# Patient Record
Sex: Male | Born: 1990 | Race: White | Hispanic: No | Marital: Single | Smoking: Never smoker
Health system: Southern US, Community
[De-identification: ages and names within clinical notes are randomized; demographics above are authoritative.]

## PROBLEM LIST (undated history)

## (undated) DIAGNOSIS — T7840XA Allergy, unspecified, initial encounter: Secondary | ICD-10-CM

## (undated) HISTORY — PX: FRACTURE SURGERY: SHX138

## (undated) HISTORY — DX: Allergy, unspecified, initial encounter: T78.40XA

---

## 2018-05-08 DIAGNOSIS — Z136 Encounter for screening for cardiovascular disorders: Secondary | ICD-10-CM | POA: Diagnosis not present

## 2018-05-08 DIAGNOSIS — Z0000 Encounter for general adult medical examination without abnormal findings: Secondary | ICD-10-CM | POA: Diagnosis not present

## 2018-05-08 DIAGNOSIS — Z23 Encounter for immunization: Secondary | ICD-10-CM | POA: Diagnosis not present

## 2019-01-06 DIAGNOSIS — Z20828 Contact with and (suspected) exposure to other viral communicable diseases: Secondary | ICD-10-CM | POA: Diagnosis not present

## 2019-02-24 ENCOUNTER — Other Ambulatory Visit: Payer: Self-pay

## 2019-02-24 DIAGNOSIS — Z20822 Contact with and (suspected) exposure to covid-19: Secondary | ICD-10-CM

## 2019-02-25 LAB — NOVEL CORONAVIRUS, NAA: SARS-CoV-2, NAA: NOT DETECTED

## 2019-03-05 ENCOUNTER — Other Ambulatory Visit: Payer: Self-pay

## 2019-03-05 DIAGNOSIS — Z20822 Contact with and (suspected) exposure to covid-19: Secondary | ICD-10-CM

## 2019-03-07 LAB — NOVEL CORONAVIRUS, NAA: SARS-CoV-2, NAA: NOT DETECTED

## 2019-05-07 ENCOUNTER — Other Ambulatory Visit: Payer: Self-pay

## 2019-05-07 DIAGNOSIS — Z20822 Contact with and (suspected) exposure to covid-19: Secondary | ICD-10-CM

## 2019-05-08 LAB — NOVEL CORONAVIRUS, NAA: SARS-CoV-2, NAA: NOT DETECTED

## 2020-02-01 DIAGNOSIS — Z20822 Contact with and (suspected) exposure to covid-19: Secondary | ICD-10-CM | POA: Diagnosis not present

## 2020-02-03 DIAGNOSIS — Z20822 Contact with and (suspected) exposure to covid-19: Secondary | ICD-10-CM | POA: Diagnosis not present

## 2020-02-04 ENCOUNTER — Ambulatory Visit
Admission: RE | Admit: 2020-02-04 | Discharge: 2020-02-04 | Disposition: A | Discharge status: Home / Self Care | Payer: No Typology Code available for payment source | Source: Ambulatory Visit | Attending: Family Medicine | Admitting: Family Medicine

## 2020-02-04 ENCOUNTER — Other Ambulatory Visit: Payer: Self-pay

## 2020-02-04 ENCOUNTER — Other Ambulatory Visit (INDEPENDENT_AMBULATORY_CARE_PROVIDER_SITE_OTHER): Payer: Self-pay

## 2020-02-04 ENCOUNTER — Encounter: Payer: Self-pay | Admitting: Family Medicine

## 2020-02-04 ENCOUNTER — Ambulatory Visit (INDEPENDENT_AMBULATORY_CARE_PROVIDER_SITE_OTHER): Payer: Self-pay | Admitting: Family Medicine

## 2020-02-04 VITALS — BP 126/84 | Ht 69.5 in | Wt 184.0 lb

## 2020-02-04 DIAGNOSIS — Y9315 Activity, underwater diving and snorkeling: Secondary | ICD-10-CM

## 2020-02-04 DIAGNOSIS — Z0189 Encounter for other specified special examinations: Secondary | ICD-10-CM

## 2020-02-04 LAB — POCT HEMOGLOBIN: Hemoglobin: 15.6 g/dL — AB (ref 11–14.6)

## 2020-02-04 LAB — POCT UA - GLUCOSE/PROTEIN
Glucose, UA: NEGATIVE
Protein, UA: NEGATIVE

## 2020-02-04 LAB — GLUCOSE, POCT (MANUAL RESULT ENTRY): POC Glucose: 89 mg/dl (ref 70–99)

## 2020-02-04 NOTE — Progress Notes (Signed)
PCP: No primary care provider on file.  Subjective:   HPI: Patient is a 29 y.o. male here for Patient is a 29 y.o. male here for dive physical.  Patient reports that he has been diving for a couple of years, he just completed the diving course at [facility], and has gone on a couple of diving trips.  He reports no issues with diving, denies any panic attacks, history of barotrauma, seizures, pneumothorax, chronic sinus issues, neurologic disorders, any cancer history, hearing problems/tinnitus, asthma.  He has not had Covid and has had the Covid vaccine.  No past medical history on file.  No current outpatient medications on file prior to visit.   No current facility-administered medications on file prior to visit.    History reviewed. No pertinent surgical history.  Not on File  Social History   Socioeconomic History  . Marital status: Single    Spouse name: Not on file  . Number of children: Not on file  . Years of education: Not on file  . Highest education level: Not on file  Occupational History  . Not on file  Tobacco Use  . Smoking status: Not on file  Substance and Sexual Activity  . Alcohol use: Not on file  . Drug use: Not on file  . Sexual activity: Not on file  Other Topics Concern  . Not on file  Social History Narrative  . Not on file   Social Determinants of Health   Financial Resource Strain:   . Difficulty of Paying Living Expenses: Not on file  Food Insecurity:   . Worried About Programme researcher, broadcasting/film/video in the Last Year: Not on file  . Ran Out of Food in the Last Year: Not on file  Transportation Needs:   . Lack of Transportation (Medical): Not on file  . Lack of Transportation (Non-Medical): Not on file  Physical Activity:   . Days of Exercise per Week: Not on file  . Minutes of Exercise per Session: Not on file  Stress:   . Feeling of Stress : Not on file  Social Connections:   . Frequency of Communication with Friends and Family:  Not on file  . Frequency of Social Gatherings with Friends and Family: Not on file  . Attends Religious Services: Not on file  . Active Member of Clubs or Organizations: Not on file  . Attends Banker Meetings: Not on file  . Marital Status: Not on file  Intimate Partner Violence:   . Fear of Current or Ex-Partner: Not on file  . Emotionally Abused: Not on file  . Physically Abused: Not on file  . Sexually Abused: Not on file    No family history on file.  BP 126/84   Ht 5' 9.5" (1.765 m)   Wt 184 lb (83.5 kg)   BMI 26.78 kg/m   Review of Systems: See HPI above.  Labs: Hemoglobin 15.6 glucose 89 UA within normal limits  Imaging: CXR shows no abnormalities.     Objective:  Physical Exam:  Gen: NAD, comfortable in exam room HEENT: EOMI, PERRL, normal visual field testing, oropharynx nonerythematous and within normal limits, tympanic membranes visualized bilaterally and without any abnormalities. Cardiovascular: Regular rate and rhythm both seated and standing, no murmurs, rubs or gallops.  Normal distal pulses in upper and lower extremities bilaterally. Respiratory: Clear to all station bilaterally, no crackles or rails Abdomen: Normal bowel sounds, nontender to palpation, nondistended Neuro: Normal strength all extremities, negative  Romberg, normal tandem gait testing, normal single leg and double leg squat, normal cerebellar testing, no problems with horizontal saccades, vertical saccades or fixed gaze with rotation.  Cranial nerves II through X intact.  Normal patellar reflexes bilaterally. Psych: Appropriate, no obvious anxiety or depression    Assessment & Plan:  1.  Diving physical exam  Patient does not have any history or physical exam findings that would prohibit him from diving.  Work-up including EKG, CXR, hemoglobin, glucose, and UA all within normal limits.  I find no medical reason why patient should be disqualified from participation in scuba  diving.  Patient is cleared from medical perspective.  Discussed signs and symptoms of barotrauma, and reasons to return.  Patient voices understanding.

## 2021-01-11 IMAGING — CR DG CHEST 2V
2 series · 2 of 2 positions shown · non-contrast
Comparison: None.

CLINICAL DATA: Dive physical.

EXAM:
CHEST - 2 VIEW

[w chest pa]
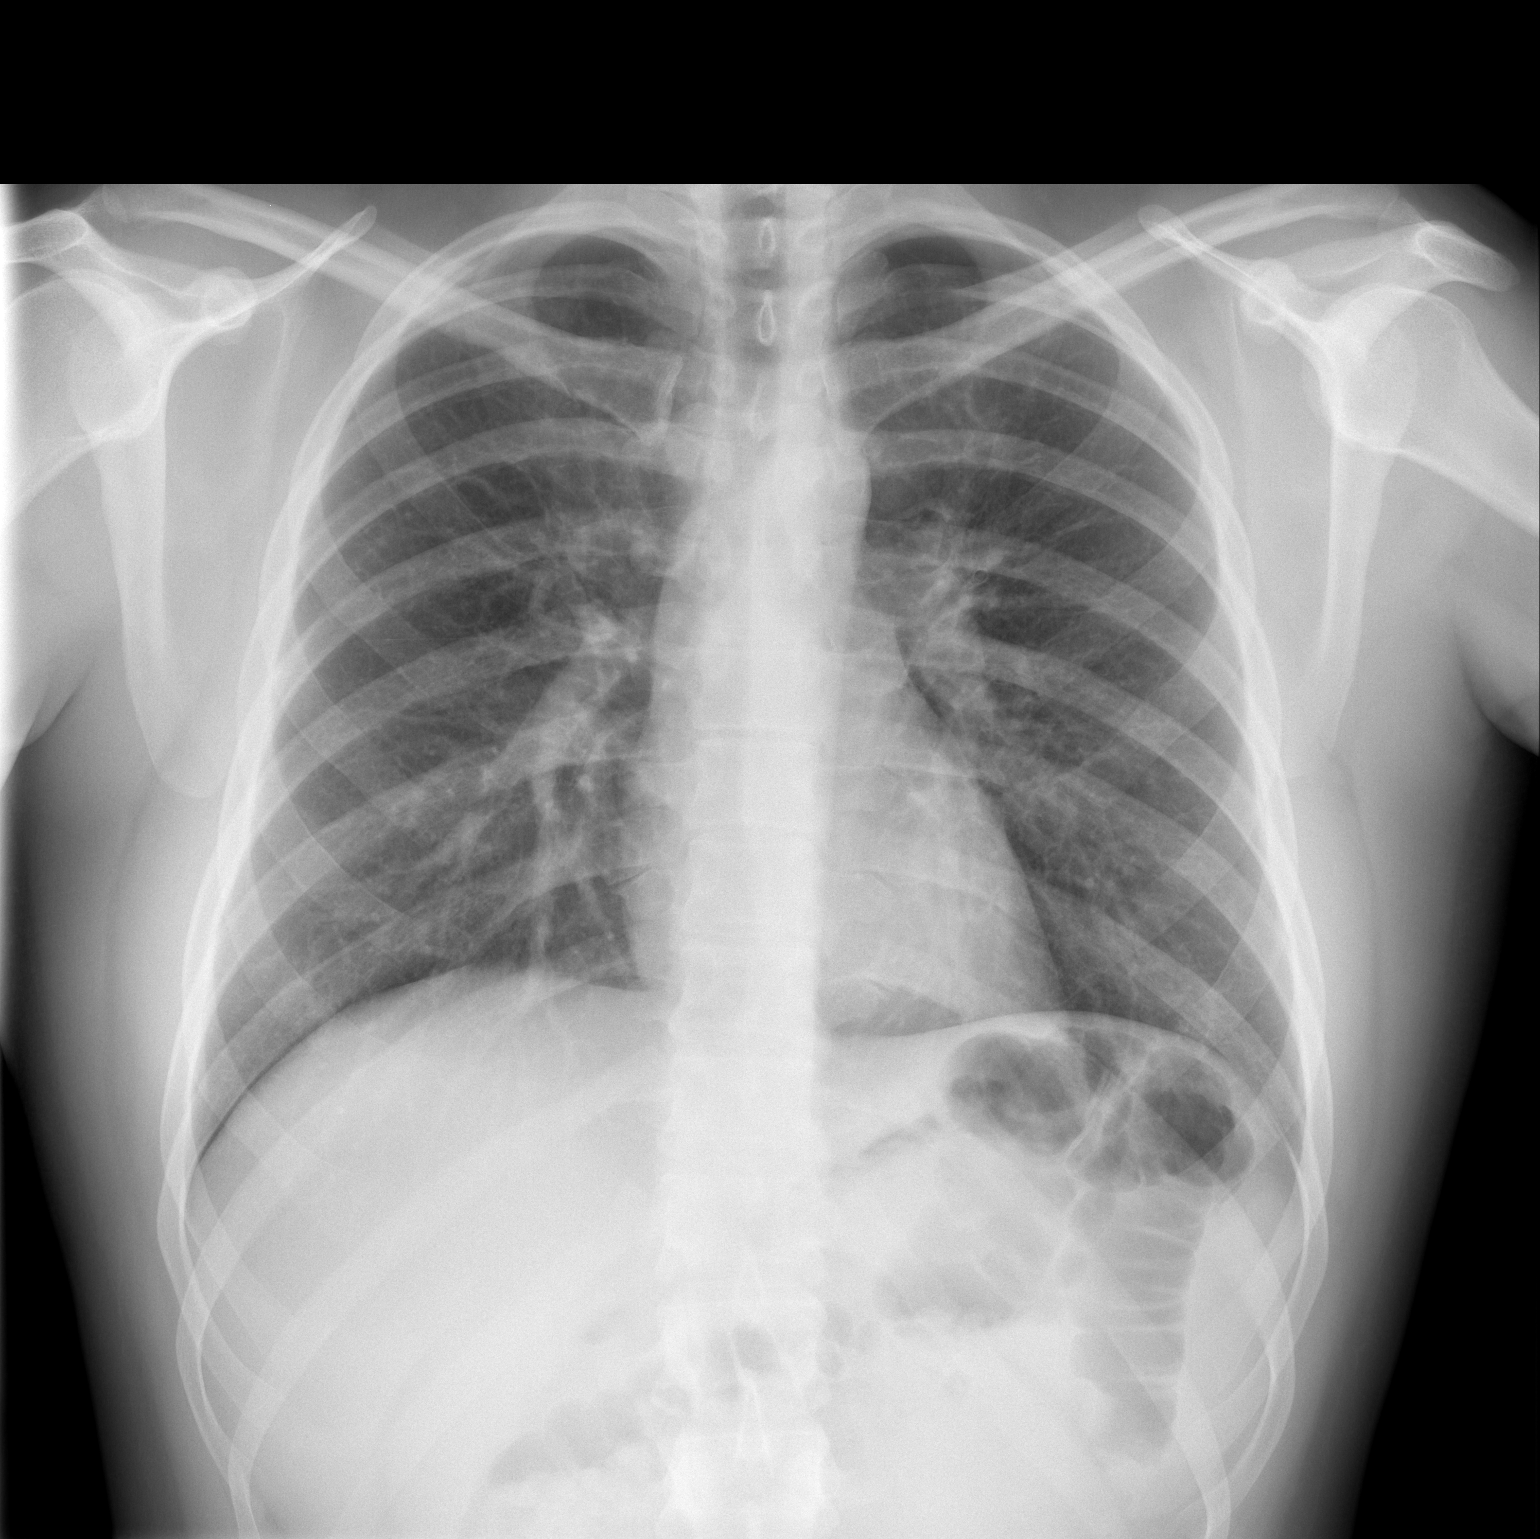

[w chest lat]
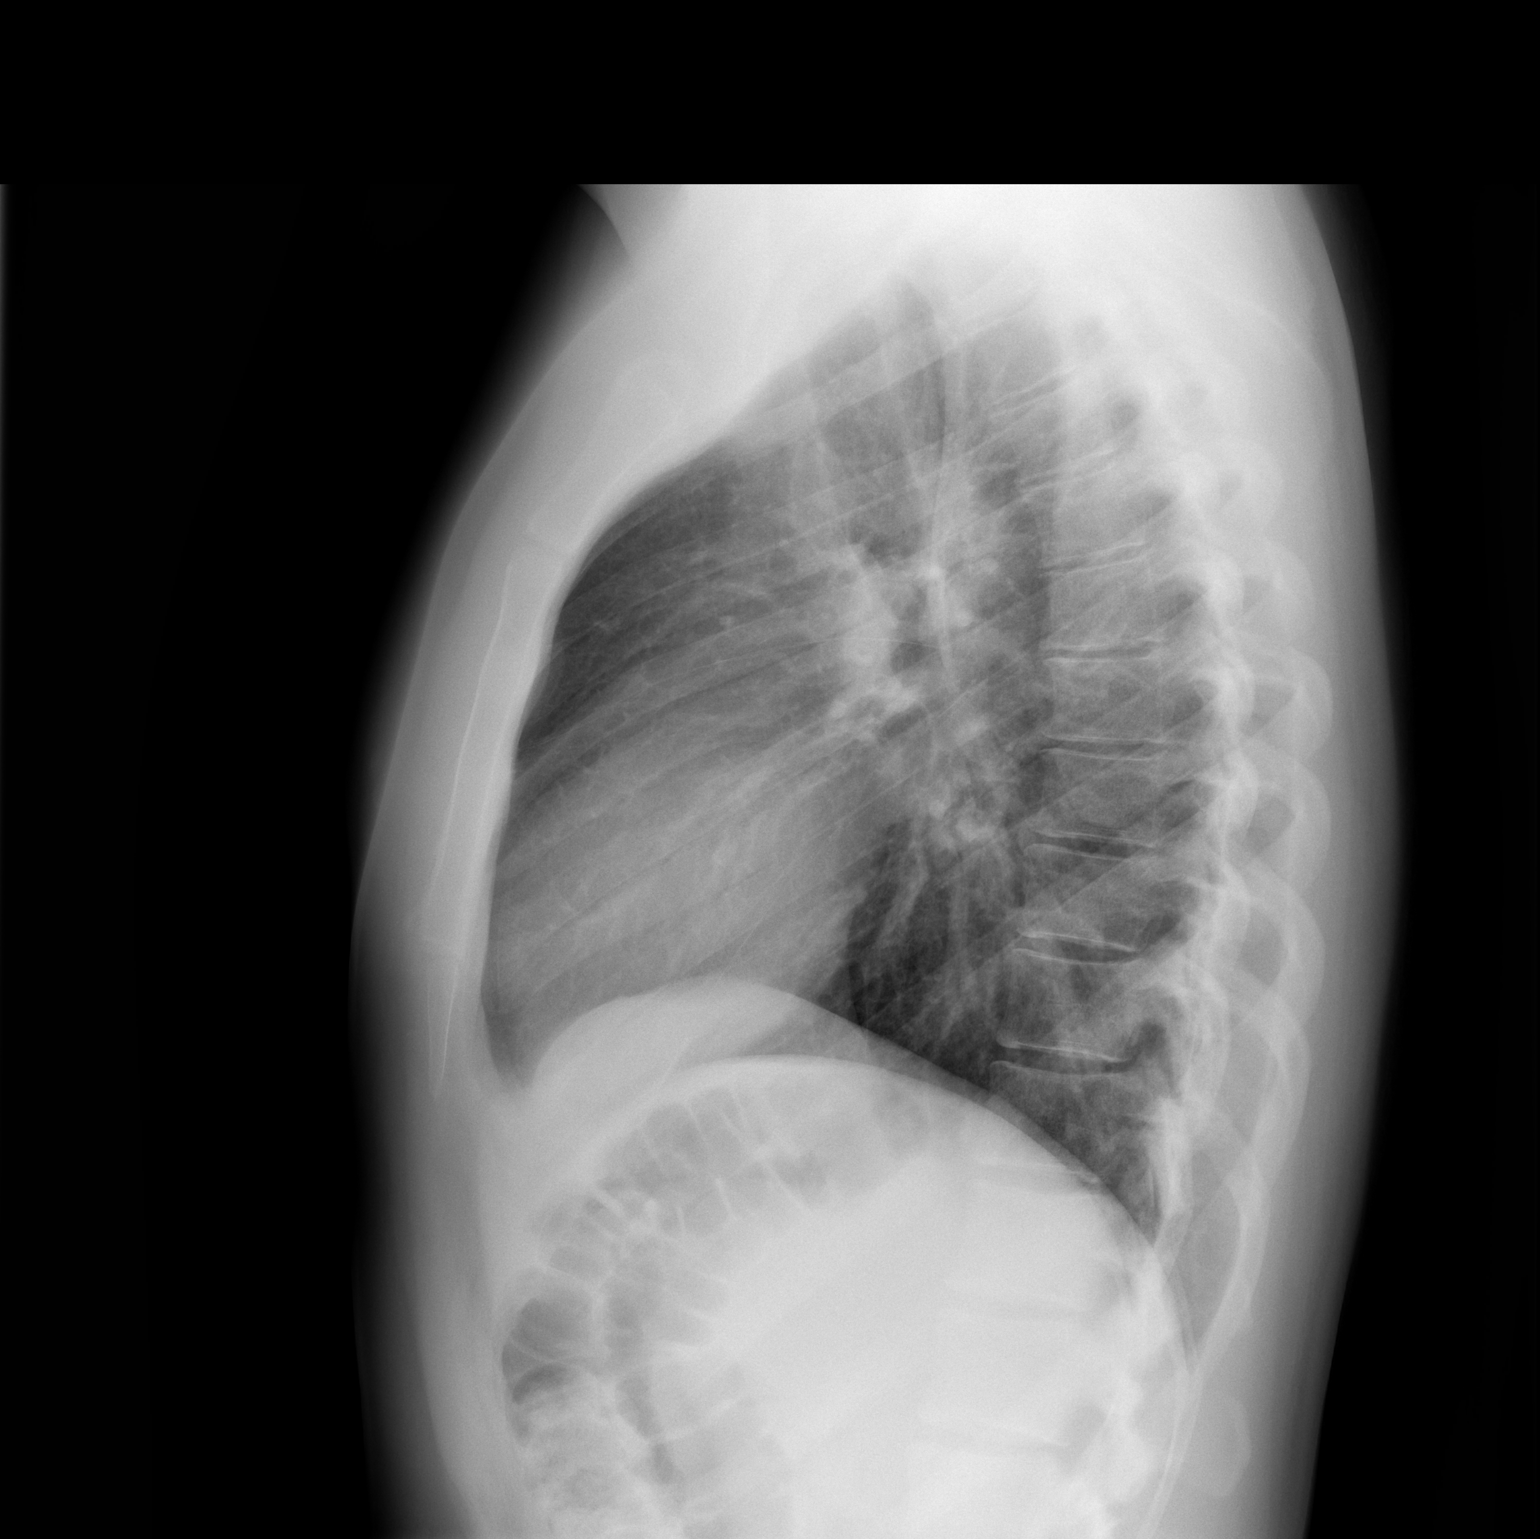

[2 of 2 positions shown; findings below may reference images not displayed]

FINDINGS: The cardiomediastinal contours are normal. The lungs are clear.
Pulmonary vasculature is normal. No consolidation, pleural effusion,
or pneumothorax. No acute osseous abnormalities are seen.
IMPRESSION: Negative radiographs of the chest.

## 2022-07-10 ENCOUNTER — Encounter: Payer: Self-pay | Admitting: Family Medicine

## 2022-07-10 ENCOUNTER — Ambulatory Visit (INDEPENDENT_AMBULATORY_CARE_PROVIDER_SITE_OTHER): Payer: Managed Care, Other (non HMO) | Admitting: Family Medicine

## 2022-07-10 VITALS — BP 118/70 | HR 76 | Temp 98.0°F | Ht 69.5 in | Wt 199.1 lb

## 2022-07-10 DIAGNOSIS — Z0000 Encounter for general adult medical examination without abnormal findings: Secondary | ICD-10-CM | POA: Diagnosis not present

## 2022-07-10 DIAGNOSIS — Z1159 Encounter for screening for other viral diseases: Secondary | ICD-10-CM

## 2022-07-10 DIAGNOSIS — Z114 Encounter for screening for human immunodeficiency virus [HIV]: Secondary | ICD-10-CM

## 2022-07-10 DIAGNOSIS — R202 Paresthesia of skin: Secondary | ICD-10-CM

## 2022-07-10 LAB — COMPREHENSIVE METABOLIC PANEL
ALT: 32 U/L (ref 0–53)
AST: 21 U/L (ref 0–37)
Albumin: 4.5 g/dL (ref 3.5–5.2)
Alkaline Phosphatase: 51 U/L (ref 39–117)
BUN: 15 mg/dL (ref 6–23)
CO2: 27 mEq/L (ref 19–32)
Calcium: 9.9 mg/dL (ref 8.4–10.5)
Chloride: 100 mEq/L (ref 96–112)
Creatinine, Ser: 0.97 mg/dL (ref 0.40–1.50)
GFR: 103.52 mL/min (ref >60.00)
Glucose, Bld: 92 mg/dL (ref 70–99)
Potassium: 4.9 mEq/L (ref 3.5–5.1)
Sodium: 137 mEq/L (ref 135–145)
Total Bilirubin: 0.6 mg/dL (ref 0.2–1.2)
Total Protein: 7.2 g/dL (ref 6.0–8.3)

## 2022-07-10 LAB — CBC WITH DIFFERENTIAL/PLATELET
Basophils Absolute: 0 10*3/uL (ref 0.0–0.1)
Basophils Relative: 0.7 % (ref 0.0–3.0)
Eosinophils Absolute: 0.1 10*3/uL (ref 0.0–0.7)
Eosinophils Relative: 1.3 % (ref 0.0–5.0)
HCT: 45.7 % (ref 39.0–52.0)
Hemoglobin: 15.7 g/dL (ref 13.0–17.0)
Lymphocytes Relative: 30.5 % (ref 12.0–46.0)
Lymphs Abs: 1.9 10*3/uL (ref 0.7–4.0)
MCHC: 34.3 g/dL (ref 30.0–36.0)
MCV: 89.6 fl (ref 78.0–100.0)
Monocytes Absolute: 0.4 10*3/uL (ref 0.1–1.0)
Monocytes Relative: 6.3 % (ref 3.0–12.0)
Neutro Abs: 3.8 10*3/uL (ref 1.4–7.7)
Neutrophils Relative %: 61.2 % (ref 43.0–77.0)
Platelets: 346 10*3/uL (ref 150.0–400.0)
RBC: 5.1 Mil/uL (ref 4.22–5.81)
RDW: 12.9 % (ref 11.5–15.5)
WBC: 6.3 10*3/uL (ref 4.0–10.5)

## 2022-07-10 LAB — VITAMIN B12: Vitamin B-12: 402 pg/mL (ref 211–911)

## 2022-07-10 LAB — LIPID PANEL
Cholesterol: 191 mg/dL (ref 0–200)
HDL: 45.4 mg/dL (ref >39.00)
LDL Cholesterol: 122 mg/dL — ABNORMAL HIGH (ref 0–99)
NonHDL: 145.49
Total CHOL/HDL Ratio: 4
Triglycerides: 116 mg/dL (ref 0.0–149.0)
VLDL: 23.2 mg/dL (ref 0.0–40.0)

## 2022-07-10 LAB — HEMOGLOBIN A1C: Hgb A1c MFr Bld: 5.2 % (ref 4.6–6.5)

## 2022-07-10 LAB — TSH: TSH: 0.74 u[IU]/mL (ref 0.35–5.50)

## 2022-07-10 NOTE — Patient Instructions (Signed)
Welcome to [facility]! It was a pleasure meeting you today. ° °As discussed, Please schedule a 12 month follow up visit today. ° °PLEASE NOTE: ° °If you had any LAB tests please let us know if you have not heard back within a few days. You may see your results on MyChart before we have a chance to review them but we will give you a call once they are reviewed by us. If we ordered any REFERRALS today, please let us know if you have not heard from their office within the next week.  °Let us know through MyChart if you are needing REFILLS, or have your pharmacy send us the request. You can also use MyChart to communicate with me or any office staff. ° °Please try these tips to maintain a healthy lifestyle: ° °Eat most of your calories during the day when you are active. Eliminate processed foods including packaged sweets (pies, cakes, cookies), reduce intake of potatoes, white bread, white pasta, and white rice. Look for whole grain options, oat flour or almond flour. ° °Each meal should contain half fruits/vegetables, one quarter protein, and one quarter carbs (no bigger than a computer mouse). ° °Cut down on sweet beverages. This includes juice, soda, and sweet tea. Also watch fruit intake, though this is a healthier sweet option, it still contains natural sugar! Limit to 3 servings daily. ° °Drink at least 1 glass of water with each meal and aim for at least 8 glasses per day ° °Exercise at least 150 minutes every week.   °

## 2022-07-10 NOTE — Progress Notes (Signed)
New Patient Office Visit  Subjective:  Patient ID: Eric [facility], male    DOB: [birth year]  Age: 32 y.o. MRN: [id number]  CC:  Chief Complaint  Patient presents with   Establish Care    Need new pcp Fasting     HPI Eric Atkins presents for new pt. Leg pain.  Insurance changes. Annual- Arms and legs-paresthesias occurring.  Not all 4 at once. Arms-lying on couch or computer work long time. Lifting ok.  Can be either arm.  Only lasts about 30sec till can move.  Not waking him.  Legs-occur when cross legged or sitting strange positons.  Getting back into exercise.   Past Medical History:  Diagnosis Date   Allergy     Past Surgical History:  Procedure Laterality Date   FRACTURE SURGERY  August 2007   L tibia    Family History  Problem Relation Age of Onset   Hearing loss Father    Arthritis Maternal Grandmother    Diabetes Maternal Grandmother    Obesity Maternal Grandmother    Varicose Veins Maternal Grandmother    Heart disease Maternal Grandfather    Arthritis Paternal Grandmother    Cancer Paternal Grandmother        lung, skin   Diabetes Paternal Grandmother    Obesity Paternal Grandmother    Varicose Veins Paternal Grandmother    Arthritis Paternal Grandfather    Hearing loss Paternal Grandfather    Obesity Paternal Grandfather    Stroke Paternal Grandfather     Social History   Socioeconomic History   Marital status: Single    Spouse name: Not on file   Number of children: 0   Years of education: Not on file   Highest education level: Not on file  Occupational History   Occupation: Careers information officer at [employer]: [employer]  Tobacco Use   Smoking status: Never   Smokeless tobacco: Never  Vaping Use   Vaping Use: Never used  Substance and Sexual Activity   Alcohol use: Yes    Alcohol/week: 1.0 standard drink of alcohol    Types: 1 Cans of beer per week   Drug use: Never   Sexual activity: Yes  Other Topics Concern    Not on file  Social History Narrative   Not on file   Social Determinants of Health   Financial Resource Strain: Not on file  Food Insecurity: Not on file  Transportation Needs: Not on file  Physical Activity: Not on file  Stress: Not on file  Social Connections: Not on file  Intimate Partner Violence: Not on file    ROS  ROS: Gen: no fever, chills  Skin: no rash, itching ENT: no ear pain, ear drainage, nasal congestion, rhinorrhea, sinus pressure, sore throat Eyes: no blurry vision, double vision Resp: no cough, wheeze,SOB CV: no CP, palpitations, LE edema,  GI: no heartburn, n/v/d/c, abd pain GU: no dysuria, urgency, frequency, hematuria MSK: no joint pain, myalgias, back pain Neuro: no dizziness,  weakness, vertigo-migraine-w/w/o aura.  3 in 12 months.  Tylenol/advil.  Will have to leave work.   Psych: no depression, anxiety, insomnia, SI   Objective:   Today's Vitals: BP 118/70   Pulse 76   Temp 98 F (36.7 C) (Temporal)   Ht 5' 9.5" (1.765 m)   Wt 199 lb 2 oz (90.3 kg)   SpO2 98%   BMI 28.98 kg/m   Physical Exam  Gen: WDWN NAD  WM HEENT: NCAT,  conjunctiva not injected, sclera nonicteric TM WNL B, OP moist, no exudates  NECK:  supple, no thyromegaly, no nodes, no carotid bruits CARDIAC: RRR, S1S2+, no murmur. DP 2+B LUNGS: CTAB. No wheezes ABDOMEN:  BS+, soft, NTND, No HSM, no masses EXT:  no edema MSK: no gross abnormalities.  NEURO: A&O x3.  CN II-XII intact.  PSYCH: normal mood. Good eye contact   Assessment & Plan:   Problem List Items Addressed This Visit   None Visit Diagnoses     Wellness examination    -  Primary   Relevant Orders   Comprehensive metabolic panel   Hemoglobin A1c   Hepatitis C antibody   HIV Antibody (routine testing w rflx)   Lipid panel   TSH   CBC with Differential/Platelet   Vitamin B12   Paresthesia       Relevant Orders   Vitamin B12   Screening for HIV without presence of risk factors       Relevant Orders    HIV Antibody (routine testing w rflx)   Encounter for hepatitis C screening test for low risk patient       Relevant Orders   Hepatitis C antibody      Wellness-anticipatory guidance.  Work on [employer].  Check CBC,CMP,lipids,TSH, A1C.  F/u 1 yr  Paresthesias-suspect superficial nerve compressions.  Check B12.  Discussed mechanism, prevention, stretches, etc.   No outpatient encounter medications on file as of 07/10/2022.   No facility-administered encounter medications on file as of 07/10/2022.    Follow-up: No follow-ups on file.   Wellington Hampshire, MD

## 2022-07-11 LAB — HIV ANTIBODY (ROUTINE TESTING W REFLEX): HIV 1&2 Ab, 4th Generation: NONREACTIVE

## 2022-07-11 LAB — HEPATITIS C ANTIBODY: Hepatitis C Ab: NONREACTIVE

## 2023-11-06 DIAGNOSIS — D239 Other benign neoplasm of skin, unspecified: Secondary | ICD-10-CM | POA: Diagnosis not present

## 2023-11-06 DIAGNOSIS — L578 Other skin changes due to chronic exposure to nonionizing radiation: Secondary | ICD-10-CM | POA: Diagnosis not present

## 2023-11-06 DIAGNOSIS — D2271 Melanocytic nevi of right lower limb, including hip: Secondary | ICD-10-CM | POA: Diagnosis not present

## 2023-11-06 DIAGNOSIS — L814 Other melanin hyperpigmentation: Secondary | ICD-10-CM | POA: Diagnosis not present

## 2024-03-25 DIAGNOSIS — G43109 Migraine with aura, not intractable, without status migrainosus: Secondary | ICD-10-CM | POA: Diagnosis not present

## 2024-03-25 DIAGNOSIS — M2560 Stiffness of unspecified joint, not elsewhere classified: Secondary | ICD-10-CM | POA: Diagnosis not present

## 2024-03-25 DIAGNOSIS — E559 Vitamin D deficiency, unspecified: Secondary | ICD-10-CM | POA: Diagnosis not present

## 2024-03-25 DIAGNOSIS — Z0000 Encounter for general adult medical examination without abnormal findings: Secondary | ICD-10-CM | POA: Diagnosis not present
